# Patient Record
Sex: Male | Born: 1995 | Race: White | Hispanic: No | Marital: Single | Smoking: Never smoker
Health system: Southern US, Community
[De-identification: ages and names within clinical notes are randomized; demographics above are authoritative.]

## PROBLEM LIST (undated history)

## (undated) DIAGNOSIS — S069X9A Unspecified intracranial injury with loss of consciousness of unspecified duration, initial encounter: Secondary | ICD-10-CM

## (undated) DIAGNOSIS — F909 Attention-deficit hyperactivity disorder, unspecified type: Secondary | ICD-10-CM

## (undated) DIAGNOSIS — S069XAA Unspecified intracranial injury with loss of consciousness status unknown, initial encounter: Secondary | ICD-10-CM

## (undated) HISTORY — PX: OTHER SURGICAL HISTORY: SHX169

## (undated) HISTORY — PX: TOTAL HIP ARTHROPLASTY: SHX124

---
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
## 2017-01-05 ENCOUNTER — Encounter: Payer: Self-pay | Admitting: Dietician

## 2017-09-23 ENCOUNTER — Emergency Department
Admission: EM | Admit: 2017-09-23 | Discharge: 2017-09-23 | Disposition: A | Discharge status: Home / Self Care | Payer: Medicaid Other | Attending: Emergency Medicine | Admitting: Emergency Medicine

## 2017-09-23 ENCOUNTER — Emergency Department: Payer: Medicaid Other

## 2017-09-23 ENCOUNTER — Encounter: Payer: Self-pay | Admitting: Emergency Medicine

## 2017-09-23 ENCOUNTER — Other Ambulatory Visit: Payer: Self-pay

## 2017-09-23 DIAGNOSIS — Y9383 Activity, rough housing and horseplay: Secondary | ICD-10-CM | POA: Insufficient documentation

## 2017-09-23 DIAGNOSIS — S0083XA Contusion of other part of head, initial encounter: Secondary | ICD-10-CM | POA: Diagnosis not present

## 2017-09-23 DIAGNOSIS — W500XXA Accidental hit or strike by another person, initial encounter: Secondary | ICD-10-CM | POA: Diagnosis not present

## 2017-09-23 DIAGNOSIS — Y929 Unspecified place or not applicable: Secondary | ICD-10-CM | POA: Diagnosis not present

## 2017-09-23 DIAGNOSIS — Z79899 Other long term (current) drug therapy: Secondary | ICD-10-CM | POA: Diagnosis not present

## 2017-09-23 DIAGNOSIS — Z96642 Presence of left artificial hip joint: Secondary | ICD-10-CM | POA: Insufficient documentation

## 2017-09-23 DIAGNOSIS — S0993XA Unspecified injury of face, initial encounter: Secondary | ICD-10-CM | POA: Diagnosis present

## 2017-09-23 DIAGNOSIS — F17200 Nicotine dependence, unspecified, uncomplicated: Secondary | ICD-10-CM | POA: Diagnosis not present

## 2017-09-23 DIAGNOSIS — Y999 Unspecified external cause status: Secondary | ICD-10-CM | POA: Diagnosis not present

## 2017-09-23 MED ORDER — TETANUS-DIPHTH-ACELL PERTUSSIS 5-2.5-18.5 LF-MCG/0.5 IM SUSP
0.5000 mL | Freq: Once | INTRAMUSCULAR | Status: AC
  Administered 2017-09-23: 0.5 mL via INTRAMUSCULAR
  Filled 2017-09-23: qty 0.5
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
## 2017-09-23 NOTE — Discharge Instructions (Addendum)
Apply ice to your nose to decrease swelling.  Take Tylenol or ibuprofen if needed for pain.  If you become worse return to emergency department or follow-up with your regular doctor.  He can also see the [facility] clinic acute care if you have any other issues.
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
## 2017-09-23 NOTE — ED Notes (Signed)
Patient transported to X-ray 

## 2017-11-15 ENCOUNTER — Telehealth: Payer: Self-pay | Admitting: Pediatrics

## 2017-11-15 ENCOUNTER — Ambulatory Visit: Payer: Medicaid Other | Attending: Pediatrics

## 2017-11-15 DIAGNOSIS — M21371 Foot drop, right foot: Secondary | ICD-10-CM | POA: Diagnosis present

## 2017-11-15 DIAGNOSIS — R29818 Other symptoms and signs involving the nervous system: Secondary | ICD-10-CM | POA: Insufficient documentation

## 2017-11-15 NOTE — Telephone Encounter (Signed)
Pt was evaluated at [facility] today (11/15/17) and presents with recent decline in motor function of R toes with unclear etiology. After initial examination findings, there are concerns that pt needs further medical assessment prior to physical therapy treatment. Pt and pt's mother agreeable to plan. Spoke with MD who stated that she will be getting in contact with pt's mother to discuss neurology referral.   Georg Ruddle, SPT 3:32 PM,11/15/17  Student Physical Therapist  [facility] Phone: [phone]
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
## 2017-11-29 ENCOUNTER — Other Ambulatory Visit: Payer: Self-pay | Admitting: Neurology

## 2017-11-29 DIAGNOSIS — S069X9A Unspecified intracranial injury with loss of consciousness of unspecified duration, initial encounter: Secondary | ICD-10-CM

## 2017-12-03 ENCOUNTER — Ambulatory Visit
Admission: RE | Admit: 2017-12-03 | Discharge: 2017-12-03 | Disposition: A | Discharge status: Home / Self Care | Payer: Medicaid Other | Source: Ambulatory Visit | Attending: Neurology | Admitting: Neurology

## 2017-12-03 DIAGNOSIS — S069X9A Unspecified intracranial injury with loss of consciousness of unspecified duration, initial encounter: Secondary | ICD-10-CM | POA: Diagnosis present

## 2017-12-03 MED ORDER — GADOBENATE DIMEGLUMINE 529 MG/ML IV SOLN
19.0000 mL | Freq: Once | INTRAVENOUS | Status: AC | PRN
  Administered 2017-12-03: 19 mL via INTRAVENOUS

## 2018-01-25 DIAGNOSIS — F5101 Primary insomnia: Secondary | ICD-10-CM | POA: Insufficient documentation

## 2018-01-25 DIAGNOSIS — E7800 Pure hypercholesterolemia, unspecified: Secondary | ICD-10-CM | POA: Insufficient documentation

## 2018-01-25 DIAGNOSIS — Z87820 Personal history of traumatic brain injury: Secondary | ICD-10-CM | POA: Insufficient documentation

## 2018-03-30 ENCOUNTER — Encounter: Payer: Self-pay | Admitting: Physical Therapy

## 2018-03-30 ENCOUNTER — Other Ambulatory Visit: Payer: Self-pay

## 2018-03-30 ENCOUNTER — Ambulatory Visit: Payer: Medicaid Other | Attending: Neurology | Admitting: Physical Therapy

## 2018-03-30 DIAGNOSIS — M21371 Foot drop, right foot: Secondary | ICD-10-CM | POA: Diagnosis not present

## 2018-03-30 DIAGNOSIS — M6281 Muscle weakness (generalized): Secondary | ICD-10-CM | POA: Diagnosis present

## 2018-03-30 DIAGNOSIS — R262 Difficulty in walking, not elsewhere classified: Secondary | ICD-10-CM | POA: Diagnosis present

## 2018-03-30 DIAGNOSIS — R29818 Other symptoms and signs involving the nervous system: Secondary | ICD-10-CM | POA: Diagnosis present
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
## 2018-04-04 ENCOUNTER — Ambulatory Visit: Payer: Medicaid Other

## 2018-04-04 DIAGNOSIS — R262 Difficulty in walking, not elsewhere classified: Secondary | ICD-10-CM

## 2018-04-04 DIAGNOSIS — M6281 Muscle weakness (generalized): Secondary | ICD-10-CM

## 2018-04-04 DIAGNOSIS — M21371 Foot drop, right foot: Secondary | ICD-10-CM | POA: Diagnosis not present
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
## 2018-04-04 NOTE — Patient Instructions (Signed)
Access Code: [email or web address]  URL: [email or web address]  Date: 04/04/2018  Prepared by: Roxana Hires   Exercises  Soleus Stretch on Wall - 2 reps - 30s on each side hold - 2x daily - 7x weekly  Standing Gastroc Stretch on Step with Counter Support - 2 reps - 30s hold - 2x daily - 7x weekly

## 2018-04-06 ENCOUNTER — Ambulatory Visit: Payer: Medicaid Other | Admitting: Physical Therapy

## 2018-04-06 ENCOUNTER — Encounter: Payer: Self-pay | Admitting: Physical Therapy

## 2018-04-06 DIAGNOSIS — R29818 Other symptoms and signs involving the nervous system: Secondary | ICD-10-CM

## 2018-04-06 DIAGNOSIS — M21371 Foot drop, right foot: Secondary | ICD-10-CM

## 2018-04-06 DIAGNOSIS — M6281 Muscle weakness (generalized): Secondary | ICD-10-CM

## 2018-04-06 DIAGNOSIS — R262 Difficulty in walking, not elsewhere classified: Secondary | ICD-10-CM
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
## 2018-04-06 NOTE — Patient Instructions (Addendum)
Home exercise: Put walkaide on right leg (line orange button to center of knee just below the knee- look at picture)  Turn walkaide on to level 3  Press and center button until you hear a beep- then pick foot up as it comes on (the unit will come on for 3 sec, off for 6 sec for a total of 5 min)  Do 3x a day

## 2018-04-12 ENCOUNTER — Ambulatory Visit: Payer: Medicaid Other

## 2018-04-12 DIAGNOSIS — M6281 Muscle weakness (generalized): Secondary | ICD-10-CM

## 2018-04-12 DIAGNOSIS — M21371 Foot drop, right foot: Secondary | ICD-10-CM | POA: Diagnosis not present

## 2018-04-12 DIAGNOSIS — R29818 Other symptoms and signs involving the nervous system: Secondary | ICD-10-CM

## 2018-04-12 DIAGNOSIS — R262 Difficulty in walking, not elsewhere classified: Secondary | ICD-10-CM
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
## 2018-04-19 ENCOUNTER — Ambulatory Visit: Payer: Medicaid Other | Admitting: Physical Therapy

## 2018-04-19 ENCOUNTER — Encounter: Payer: Self-pay | Admitting: Physical Therapy

## 2018-04-19 DIAGNOSIS — M6281 Muscle weakness (generalized): Secondary | ICD-10-CM

## 2018-04-19 DIAGNOSIS — M21371 Foot drop, right foot: Secondary | ICD-10-CM | POA: Diagnosis not present

## 2018-04-19 DIAGNOSIS — R29818 Other symptoms and signs involving the nervous system: Secondary | ICD-10-CM

## 2018-04-19 DIAGNOSIS — R262 Difficulty in walking, not elsewhere classified: Secondary | ICD-10-CM
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
## 2018-04-21 ENCOUNTER — Ambulatory Visit: Payer: Medicaid Other | Attending: Neurology | Admitting: Physical Therapy

## 2018-04-21 DIAGNOSIS — R262 Difficulty in walking, not elsewhere classified: Secondary | ICD-10-CM | POA: Insufficient documentation

## 2018-04-21 DIAGNOSIS — R29818 Other symptoms and signs involving the nervous system: Secondary | ICD-10-CM

## 2018-04-21 DIAGNOSIS — M21371 Foot drop, right foot: Secondary | ICD-10-CM | POA: Diagnosis present

## 2018-04-21 DIAGNOSIS — M6281 Muscle weakness (generalized): Secondary | ICD-10-CM | POA: Diagnosis not present
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
## 2018-04-26 ENCOUNTER — Ambulatory Visit: Payer: Medicaid Other | Admitting: Physical Therapy

## 2018-04-26 ENCOUNTER — Encounter: Payer: Self-pay | Admitting: Physical Therapy

## 2018-04-26 DIAGNOSIS — M6281 Muscle weakness (generalized): Secondary | ICD-10-CM

## 2018-04-26 DIAGNOSIS — R262 Difficulty in walking, not elsewhere classified: Secondary | ICD-10-CM

## 2018-04-26 DIAGNOSIS — M21371 Foot drop, right foot: Secondary | ICD-10-CM

## 2018-04-26 DIAGNOSIS — R29818 Other symptoms and signs involving the nervous system: Secondary | ICD-10-CM
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
## 2018-04-28 ENCOUNTER — Ambulatory Visit: Payer: Medicaid Other | Admitting: Physical Therapy

## 2018-04-28 ENCOUNTER — Encounter: Payer: Self-pay | Admitting: Physical Therapy

## 2018-04-28 DIAGNOSIS — M21371 Foot drop, right foot: Secondary | ICD-10-CM

## 2018-04-28 DIAGNOSIS — R29818 Other symptoms and signs involving the nervous system: Secondary | ICD-10-CM

## 2018-04-28 DIAGNOSIS — M6281 Muscle weakness (generalized): Secondary | ICD-10-CM | POA: Diagnosis not present

## 2018-04-28 DIAGNOSIS — R262 Difficulty in walking, not elsewhere classified: Secondary | ICD-10-CM
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
## 2018-05-03 ENCOUNTER — Ambulatory Visit: Payer: Medicaid Other | Admitting: Physical Therapy

## 2018-05-05 ENCOUNTER — Ambulatory Visit: Payer: Medicaid Other

## 2018-05-05 DIAGNOSIS — M6281 Muscle weakness (generalized): Secondary | ICD-10-CM

## 2018-05-05 DIAGNOSIS — R29818 Other symptoms and signs involving the nervous system: Secondary | ICD-10-CM

## 2018-05-05 DIAGNOSIS — M21371 Foot drop, right foot: Secondary | ICD-10-CM

## 2018-05-05 DIAGNOSIS — R262 Difficulty in walking, not elsewhere classified: Secondary | ICD-10-CM
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
## 2018-05-10 ENCOUNTER — Ambulatory Visit: Payer: Medicaid Other | Admitting: Physical Therapy

## 2018-05-12 ENCOUNTER — Encounter: Payer: Self-pay | Admitting: Physical Therapy

## 2018-05-12 ENCOUNTER — Ambulatory Visit: Payer: Medicaid Other | Admitting: Physical Therapy

## 2018-05-12 DIAGNOSIS — R262 Difficulty in walking, not elsewhere classified: Secondary | ICD-10-CM

## 2018-05-12 DIAGNOSIS — M21371 Foot drop, right foot: Secondary | ICD-10-CM

## 2018-05-12 DIAGNOSIS — M6281 Muscle weakness (generalized): Secondary | ICD-10-CM

## 2018-05-12 DIAGNOSIS — R29818 Other symptoms and signs involving the nervous system: Secondary | ICD-10-CM
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
## 2018-05-12 NOTE — Patient Instructions (Signed)
Access Code: [email or web address]  URL: [email or web address]  Date: 05/12/2018  Prepared by: Blanche East   Exercises  Seated Ankle Dorsiflexion with Resistance - 10 reps - 2 sets - 2x daily - 7x weekly

## 2018-05-17 ENCOUNTER — Encounter: Payer: Self-pay | Admitting: Physical Therapy

## 2018-05-17 ENCOUNTER — Ambulatory Visit: Payer: Medicaid Other | Admitting: Physical Therapy

## 2018-05-17 DIAGNOSIS — M21371 Foot drop, right foot: Secondary | ICD-10-CM

## 2018-05-17 DIAGNOSIS — R262 Difficulty in walking, not elsewhere classified: Secondary | ICD-10-CM

## 2018-05-17 DIAGNOSIS — M6281 Muscle weakness (generalized): Secondary | ICD-10-CM | POA: Diagnosis not present
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
## 2018-05-19 ENCOUNTER — Ambulatory Visit: Payer: Medicaid Other | Admitting: Physical Therapy

## 2018-05-19 ENCOUNTER — Encounter: Payer: Self-pay | Admitting: Physical Therapy

## 2018-05-19 DIAGNOSIS — M6281 Muscle weakness (generalized): Secondary | ICD-10-CM

## 2018-05-19 DIAGNOSIS — M21371 Foot drop, right foot: Secondary | ICD-10-CM

## 2018-05-19 DIAGNOSIS — R262 Difficulty in walking, not elsewhere classified: Secondary | ICD-10-CM
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
## 2018-05-24 ENCOUNTER — Ambulatory Visit: Payer: Medicaid Other | Attending: Neurology | Admitting: Physical Therapy

## 2018-05-24 ENCOUNTER — Encounter: Payer: Self-pay | Admitting: Physical Therapy

## 2018-05-24 DIAGNOSIS — M6281 Muscle weakness (generalized): Secondary | ICD-10-CM | POA: Insufficient documentation

## 2018-05-24 DIAGNOSIS — R262 Difficulty in walking, not elsewhere classified: Secondary | ICD-10-CM | POA: Insufficient documentation

## 2018-05-24 DIAGNOSIS — R29818 Other symptoms and signs involving the nervous system: Secondary | ICD-10-CM | POA: Diagnosis present

## 2018-05-24 DIAGNOSIS — M21371 Foot drop, right foot: Secondary | ICD-10-CM | POA: Diagnosis present
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
## 2018-05-24 NOTE — Patient Instructions (Addendum)
Toe Curl: Unilateral    With right foot resting on towel, slowly bunch up towel by curling toes. Repeat __2-3__ minutes. Do _2___ sets per session. Do ___2_ sessions per day.  [email or web address].[email or web address]   Copyright  VHI. All rights reserved.

## 2018-05-26 ENCOUNTER — Encounter: Payer: Self-pay | Admitting: Physical Therapy

## 2018-05-26 ENCOUNTER — Ambulatory Visit: Payer: Medicaid Other | Admitting: Physical Therapy

## 2018-05-26 DIAGNOSIS — M6281 Muscle weakness (generalized): Secondary | ICD-10-CM

## 2018-05-26 DIAGNOSIS — M21371 Foot drop, right foot: Secondary | ICD-10-CM

## 2018-05-26 DIAGNOSIS — R262 Difficulty in walking, not elsewhere classified: Secondary | ICD-10-CM
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
## 2018-06-01 ENCOUNTER — Ambulatory Visit: Payer: Medicaid Other | Admitting: Physical Therapy

## 2018-06-01 ENCOUNTER — Encounter: Payer: Self-pay | Admitting: Physical Therapy

## 2018-06-01 DIAGNOSIS — M6281 Muscle weakness (generalized): Secondary | ICD-10-CM

## 2018-06-01 DIAGNOSIS — R262 Difficulty in walking, not elsewhere classified: Secondary | ICD-10-CM

## 2018-06-01 DIAGNOSIS — M21371 Foot drop, right foot: Secondary | ICD-10-CM
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
## 2018-06-07 ENCOUNTER — Ambulatory Visit: Payer: Medicaid Other

## 2018-06-07 ENCOUNTER — Encounter: Payer: Self-pay | Admitting: Physical Therapy

## 2018-06-07 DIAGNOSIS — M6281 Muscle weakness (generalized): Secondary | ICD-10-CM

## 2018-06-07 DIAGNOSIS — M21371 Foot drop, right foot: Secondary | ICD-10-CM

## 2018-06-07 DIAGNOSIS — R262 Difficulty in walking, not elsewhere classified: Secondary | ICD-10-CM

## 2018-06-07 DIAGNOSIS — R29818 Other symptoms and signs involving the nervous system: Secondary | ICD-10-CM
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
## 2018-06-09 ENCOUNTER — Ambulatory Visit: Payer: Medicaid Other | Admitting: Physical Therapy

## 2018-06-13 ENCOUNTER — Ambulatory Visit: Payer: Medicaid Other

## 2018-06-13 DIAGNOSIS — M6281 Muscle weakness (generalized): Secondary | ICD-10-CM | POA: Diagnosis not present

## 2018-06-13 DIAGNOSIS — M21371 Foot drop, right foot: Secondary | ICD-10-CM

## 2018-06-13 DIAGNOSIS — R29818 Other symptoms and signs involving the nervous system: Secondary | ICD-10-CM

## 2018-06-13 DIAGNOSIS — R262 Difficulty in walking, not elsewhere classified: Secondary | ICD-10-CM
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
## 2018-06-13 NOTE — Patient Instructions (Signed)
Access Code: [email or web address]  URL: [email or web address]  Date: 06/13/2018  Prepared by: Lieutenant Diego   Exercises  Ankle Dorsiflexion with Resistance - 10 reps - 2 sets - 1x daily - 7x weekly  Ankle Eversion with Resistance - 10 reps - 2 sets - 1x daily - 7x weekly

## 2018-06-15 ENCOUNTER — Ambulatory Visit: Payer: Medicaid Other | Admitting: Physical Therapy

## 2018-06-20 ENCOUNTER — Ambulatory Visit: Payer: Medicaid Other | Attending: Neurology

## 2018-06-20 DIAGNOSIS — R29818 Other symptoms and signs involving the nervous system: Secondary | ICD-10-CM

## 2018-06-20 DIAGNOSIS — M6281 Muscle weakness (generalized): Secondary | ICD-10-CM | POA: Diagnosis present

## 2018-06-20 DIAGNOSIS — R262 Difficulty in walking, not elsewhere classified: Secondary | ICD-10-CM | POA: Insufficient documentation

## 2018-06-20 DIAGNOSIS — M21371 Foot drop, right foot: Secondary | ICD-10-CM
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
## 2018-06-22 ENCOUNTER — Ambulatory Visit: Payer: Medicaid Other | Admitting: Physical Therapy

## 2018-06-27 ENCOUNTER — Ambulatory Visit: Payer: Medicaid Other | Admitting: Physical Therapy

## 2018-06-27 ENCOUNTER — Encounter: Payer: Self-pay | Admitting: Physical Therapy

## 2018-06-27 DIAGNOSIS — M21371 Foot drop, right foot: Secondary | ICD-10-CM

## 2018-06-27 DIAGNOSIS — R262 Difficulty in walking, not elsewhere classified: Secondary | ICD-10-CM

## 2018-06-27 DIAGNOSIS — M6281 Muscle weakness (generalized): Secondary | ICD-10-CM
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
## 2018-06-29 ENCOUNTER — Ambulatory Visit: Payer: Medicaid Other | Admitting: Physical Therapy

## 2018-06-29 ENCOUNTER — Encounter: Payer: Self-pay | Admitting: Physical Therapy

## 2018-06-29 DIAGNOSIS — M6281 Muscle weakness (generalized): Secondary | ICD-10-CM

## 2018-06-29 DIAGNOSIS — M21371 Foot drop, right foot: Secondary | ICD-10-CM

## 2018-06-29 DIAGNOSIS — R262 Difficulty in walking, not elsewhere classified: Secondary | ICD-10-CM
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
## 2018-07-04 ENCOUNTER — Ambulatory Visit: Payer: Medicaid Other | Admitting: Physical Therapy

## 2018-07-06 ENCOUNTER — Ambulatory Visit: Payer: Medicaid Other | Admitting: Physical Therapy

## 2018-07-12 ENCOUNTER — Ambulatory Visit: Payer: Medicaid Other | Admitting: Physical Therapy

## 2018-07-12 NOTE — Therapy (Unsigned)
[place] MAIN [facility] SERVICES [address] [place], [place], [address] Phone: [phone]   Fax:  [phone]  Patient Details  Name: BEAUDEN TREMONT MRN: [id number] Date of Birth: [birth year] Referring Provider:  No ref. provider found  Encounter Date: 07/12/2018   DPT called patient to let patient know the status of the clinic and ensure that we will be reaching out to schedule when we re-open. DPT spoke with patien'ts mother, fielded any questions at this time and offered guidance on current home program as necessary. No further questions at this time, and DPT advised that should questions arise that they can reach out to the clinic for guidance via phone.   Lieutenant Diego PT, DPT 9:17 AM,07/12/18 [place] MAIN [facility] SERVICES [address] [place], [place], [address] Phone: [phone]   Fax:  [phone]

## 2018-07-20 ENCOUNTER — Ambulatory Visit: Payer: Medicaid Other | Admitting: Physical Therapy

## 2018-07-25 ENCOUNTER — Ambulatory Visit: Payer: Medicaid Other | Admitting: Physical Therapy

## 2018-07-27 ENCOUNTER — Ambulatory Visit: Payer: Medicaid Other | Admitting: Physical Therapy

## 2018-08-01 ENCOUNTER — Ambulatory Visit: Payer: Medicaid Other | Admitting: Physical Therapy

## 2018-08-03 ENCOUNTER — Ambulatory Visit: Payer: Medicaid Other | Admitting: Physical Therapy

## 2018-08-03 NOTE — Therapy (Signed)
[place] MAIN [facility] SERVICES [address] [place], [place], [address] Phone: [phone]   Fax:  [phone]  Patient Details  Name: Norman Shelton MRN: [id number] Date of Birth: 1995/05/28 Referring Provider:  No ref. provider found  Encounter Date: 08/03/2018  The patient has been contacted today in regards to telehealth services. The patient expressed an interest in participating in telehealth visits. Patient has been informed that an [facility] support representative will be reaching out to them to verify their insurance benefits and for scheduling        Phillips Grout PT, DPT, GCS  Huprich,Jason 08/03/2018, 3:12 PM  [place] [address] [place], [place], [address] Phone: [phone]   Fax:  [phone]

## 2018-08-08 ENCOUNTER — Ambulatory Visit: Payer: Medicaid Other | Admitting: Physical Therapy

## 2018-08-10 ENCOUNTER — Ambulatory Visit: Payer: Medicaid Other | Admitting: Physical Therapy

## 2018-08-15 ENCOUNTER — Ambulatory Visit: Payer: Medicaid Other | Admitting: Physical Therapy

## 2018-08-17 ENCOUNTER — Ambulatory Visit: Payer: Medicaid Other | Admitting: Physical Therapy

## 2018-08-22 ENCOUNTER — Other Ambulatory Visit: Payer: Self-pay

## 2018-08-22 ENCOUNTER — Ambulatory Visit: Payer: Medicaid Other | Attending: Neurology

## 2018-08-22 ENCOUNTER — Ambulatory Visit: Payer: Medicaid Other | Admitting: Physical Therapy

## 2018-08-22 DIAGNOSIS — R262 Difficulty in walking, not elsewhere classified: Secondary | ICD-10-CM | POA: Insufficient documentation

## 2018-08-22 DIAGNOSIS — M6281 Muscle weakness (generalized): Secondary | ICD-10-CM | POA: Diagnosis present
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
## 2018-08-22 NOTE — Patient Instructions (Signed)
Access Code: [email or web address]  URL: [email or web address]  Date: 08/22/2018  Prepared by: Roxana Hires   Exercises  Gastroc Stretch on Wall - 2 sets - 30 seconds hold - 2x daily - 7x weekly  Soleus Stretch on Wall - 2 sets - 30 seconds hold - 2x daily - 7x weekly  Seated Ankle Dorsiflexion with Resistance - 10 reps - 2 sets - 3 seconds hold - 1x daily - 7x weekly  Seated Ankle Eversion with Anchored Resistance - 10 reps - 2 sets - 3 seconds hold - 1x daily - 7x weekly  Single Leg Heel Raise with Chair Support - 10 reps - 2 sets - 3 seconds hold - 1x daily - 7x weekly  Single Leg Stance with Support - 2 sets - 30 seconds hold - 2x daily - 7x weekly

## 2018-08-24 ENCOUNTER — Ambulatory Visit: Payer: Medicaid Other | Admitting: Physical Therapy

## 2018-09-06 ENCOUNTER — Ambulatory Visit: Payer: Medicaid Other | Admitting: Physical Therapy

## 2018-09-08 ENCOUNTER — Ambulatory Visit: Payer: Medicaid Other | Admitting: Physical Therapy

## 2018-09-15 ENCOUNTER — Ambulatory Visit: Payer: Medicaid Other | Admitting: Physical Therapy

## 2018-09-19 ENCOUNTER — Ambulatory Visit: Payer: Medicaid Other | Admitting: Physical Therapy

## 2018-09-21 ENCOUNTER — Ambulatory Visit: Payer: Medicaid Other | Attending: Neurology | Admitting: Physical Therapy

## 2018-10-04 ENCOUNTER — Encounter: Payer: Medicaid Other | Admitting: Physical Therapy

## 2018-10-11 ENCOUNTER — Encounter: Payer: Medicaid Other | Admitting: Physical Therapy

## 2018-10-18 ENCOUNTER — Ambulatory Visit: Payer: Medicaid Other | Admitting: Physical Therapy

## 2019-08-07 ENCOUNTER — Ambulatory Visit: Payer: Medicaid Other | Admitting: Dermatology

## 2019-11-30 ENCOUNTER — Other Ambulatory Visit: Payer: Self-pay | Admitting: Dermatology

## 2021-06-29 ENCOUNTER — Other Ambulatory Visit: Payer: Self-pay

## 2021-06-29 ENCOUNTER — Ambulatory Visit
Admission: EM | Admit: 2021-06-29 | Discharge: 2021-06-29 | Disposition: A | Discharge status: Home / Self Care | Payer: Medicaid Other

## 2021-06-29 ENCOUNTER — Ambulatory Visit (INDEPENDENT_AMBULATORY_CARE_PROVIDER_SITE_OTHER): Payer: Medicaid Other

## 2021-06-29 DIAGNOSIS — S93402A Sprain of unspecified ligament of left ankle, initial encounter: Secondary | ICD-10-CM

## 2021-06-29 DIAGNOSIS — M25572 Pain in left ankle and joints of left foot: Secondary | ICD-10-CM | POA: Diagnosis not present

## 2021-06-29 MED ORDER — KETOROLAC TROMETHAMINE 60 MG/2ML IM SOLN
30.0000 mg | Freq: Once | INTRAMUSCULAR | Status: AC
  Administered 2021-06-29: 30 mg via INTRAMUSCULAR

## 2021-06-29 MED ORDER — KETOROLAC TROMETHAMINE 10 MG PO TABS: 10.0000 mg | ORAL_TABLET | Freq: Four times a day (QID) | ORAL | 0 refills | Status: AC | PRN

## 2021-06-29 NOTE — ED Triage Notes (Signed)
Patient is here for "Ankle Pain, Left". Walking outside, feeding dogs, Maybe twisting ankle". No fall. "Throbbing, tingling with walking on it". DOI: [id number]. Didn't feel it "until next day".  ?
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
## 2021-06-29 NOTE — Discharge Instructions (Addendum)
Keep your ankle elevated is much as possible to help decrease swelling and aid in healing. ? ?Apply moist heat to your ankle for 20 minutes at a time to help improve blood flow which will bring fresh oxygen and nutrients to the ligaments and help facilitate the removal of metabolic byproducts from inflammation. ? ?Take Toradol every 6 hours as needed for pain.  Take it with food.  Do not exceed more than 1 tablet every 6 hours.  You may also use over-the-counter Tylenol as needed for pain relief. ? ?Wear the ASO ankle brace when up and moving.  You may take it off at nighttime, when bathing, and when not walking on her ankle. ? ?Follow the rehabilitation exercises given your discharge instructions.  Wait to start the phase 1 exercises until 48 hours after your injury to give time for the inflammation to go down.  Progress to phase 2 after you can complete phase 1 with out any significant pain.  ?
